# Patient Record
Sex: Female | Born: 1960 | Race: White | Hispanic: No | Marital: Married | Smoking: Never smoker
Health system: Southern US, Community
[De-identification: ages and names within clinical notes are randomized; demographics above are authoritative.]

## PROBLEM LIST (undated history)

## (undated) DIAGNOSIS — D649 Anemia, unspecified: Secondary | ICD-10-CM

## (undated) HISTORY — DX: Anemia, unspecified: D64.9

---

## 1988-10-26 HISTORY — PX: DILATION AND CURETTAGE OF UTERUS: SHX78

## 2013-08-21 ENCOUNTER — Ambulatory Visit (INDEPENDENT_AMBULATORY_CARE_PROVIDER_SITE_OTHER): Payer: BC Managed Care – PPO | Admitting: Obstetrics and Gynecology

## 2013-08-21 ENCOUNTER — Encounter: Payer: Self-pay | Admitting: Obstetrics and Gynecology

## 2013-08-21 VITALS — BP 100/64 | HR 80 | Resp 16 | Ht 63.25 in | Wt 133.0 lb

## 2013-08-21 DIAGNOSIS — Z0000 Encounter for general adult medical examination without abnormal findings: Secondary | ICD-10-CM

## 2013-08-21 DIAGNOSIS — Z01419 Encounter for gynecological examination (general) (routine) without abnormal findings: Secondary | ICD-10-CM

## 2013-08-21 DIAGNOSIS — R319 Hematuria, unspecified: Secondary | ICD-10-CM

## 2013-08-21 LAB — POCT URINALYSIS DIPSTICK
Glucose, UA: NEGATIVE
Nitrite, UA: NEGATIVE
Protein, UA: NEGATIVE
Urobilinogen, UA: NEGATIVE

## 2013-08-21 LAB — HEMOGLOBIN, FINGERSTICK: Hemoglobin, fingerstick: 12.3 g/dL (ref 12.0–16.0)

## 2013-08-21 NOTE — Patient Instructions (Signed)

## 2013-08-21 NOTE — Progress Notes (Signed)
Patient ID: Grace Jackson, female   DOB: Aug 22, 1961, 52 y.o.   MRN: [id number] GYNECOLOGY VISIT  PCP:   None  Referring provider:   HPI: 52 y.o.   Married  Caucasian  female   [phone] with Patient's last menstrual period was 03/26/2012.  October 2013 was last episode of spotting. here for  AEX.   On OCPs for 30 years and stopped it last year upon recommendation of her physician in [place], where she was living at the time.  Patient states she is having significant joint and muscle pain since stopping the birth control pills.  Can't sleep due to the pain. Yoga and omega 3 fatty acids help.  Some hot flashes and night sweats.  Increased headaches. Has flashing light lights.  No nausea. No vaginal dryness.  Hgb:   12.3 Urine:  1+WBC's, 1+RBC's - asymptomatic - no history of UTIs.  GYNECOLOGIC HISTORY: Patient's last menstrual period was 03/26/2012. Sexually active:  yes Partner preference: female Contraception:   postmenopausal Menopausal hormone therapy: n/a DES exposure:  no  Blood transfusions:   no Sexually transmitted diseases:   no GYN Procedures:  no Mammogram:   2012 ONG:[id number]              Pap:  01/2012 [id number]:[id number]. History of abnormal pap smear:  no   OB History   Grav Para Term Preterm Abortions TAB SAB Ect Mult Living   3 3 3       3        LIFESTYLE: Exercise:      Yoga, walking and golf     Tobacco:       no Alcohol:         1 glass of wine per day Drug use:      no  OTHER HEALTH MAINTENANCE: Tetanus/TDap:   2006 Gardisil:  NA Influenza:    06/2012 Zostavax:    never  Bone density:  never Colonoscopy:  never  Cholesterol check: 04-18-13 KVQ:[id number]  Family History  Problem Relation Age of Onset  . Aneurysm Father   . Breast cancer Maternal Grandmother     dx'd late 80's  . Migraines Sister     There are no active problems to display for this patient.  Past Medical History  Diagnosis Date  . Anemia     --in college    Past  Surgical History  Procedure Laterality Date  . Dilation and curettage of uterus  1990    ALLERGIES: Shellfish allergy  Current Outpatient Prescriptions  Medication Sig Dispense Refill  . Multiple Vitamin (MULTIVITAMIN) capsule Take 1 capsule by mouth daily.      Marland Kitchen tretinoin (RETIN-A) 0.05 % cream Apply 1 application topically at bedtime.       No current facility-administered medications for this visit.     ROS:  Pertinent items are noted in HPI.  SOCIAL HISTORY:  Just moved back to [place] from [place], [place].  Husband works for [employer].   Children are 57, 13 and 81 years old.   PHYSICAL EXAMINATION:    BP 100/64  Pulse 80  Resp 16  Ht 5' 3.25" (1.607 m)  Wt 133 lb (60.328 kg)  BMI 23.36 kg/m2  LMP 03/26/2012   Wt Readings from Last 3 Encounters:  08/21/13 133 lb (60.328 kg)     Ht Readings from Last 3 Encounters:  08/21/13 5' 3.25" (1.607 m)    General appearance: alert, cooperative and appears stated age Head: Normocephalic, without obvious abnormality, atraumatic Neck: no adenopathy,  supple, symmetrical, trachea midline and thyroid not enlarged, symmetric, no tenderness/mass/nodules Lungs: clear to auscultation bilaterally Breasts: Inspection negative, No nipple retraction or dimpling, No nipple discharge or bleeding, No axillary or supraclavicular adenopathy, Normal to palpation without dominant masses Heart: regular rate and rhythm Abdomen: soft, non-tender; no masses,  no organomegaly Extremities: extremities normal, atraumatic, no cyanosis or edema Skin: Skin color, texture, turgor normal. No rashes or lesions Lymph nodes: Cervical, supraclavicular, and axillary nodes normal. No abnormal inguinal nodes palpated Neurologic: Grossly normal  Pelvic: External genitalia:  no lesions              Urethra:  normal appearing urethra with no masses, tenderness or lesions              Bartholins and Skenes: normal                 Vagina: normal appearing  vagina with normal color and discharge, no lesions              Cervix: normal appearance              Pap and high risk HPV testing done: yes.            Bimanual Exam:  Uterus:  uterus is normal size, shape, consistency and nontender                                      Adnexa: normal adnexa in size, nontender and no masses                                      Rectovaginal: Confirms                                      Anus:  normal sphincter tone, no lesions  ASSESSMENT  Normal gynecologic exam. Menopausal symptoms of vasomotor instability. Has significant muscular and joint pains which are causing sleep disturbance.  Headaches.  Positive urinalysis.   PLAN  Mammogram at [facility].  Order placed.  Patient will call.  Pap smear and high risk HPV testing. Counseled on HRT risks and benefits.  Will readdress this after patient has had her mammogram.   I recommend that the patient establish care with a PCP regarding her medical concerns.  Urine culture sent.  Return annually or prn   An After Visit Summary was printed and given to the patient.

## 2013-08-22 LAB — URINE CULTURE: Colony Count: NO GROWTH

## 2013-08-23 ENCOUNTER — Ambulatory Visit (HOSPITAL_COMMUNITY)
Admission: RE | Admit: 2013-08-23 | Discharge: 2013-08-23 | Disposition: A | Discharge status: Home / Self Care | Payer: BC Managed Care – PPO | Source: Ambulatory Visit | Attending: Obstetrics and Gynecology | Admitting: Obstetrics and Gynecology

## 2013-08-23 DIAGNOSIS — Z1231 Encounter for screening mammogram for malignant neoplasm of breast: Secondary | ICD-10-CM | POA: Insufficient documentation

## 2013-08-23 DIAGNOSIS — Z01419 Encounter for gynecological examination (general) (routine) without abnormal findings: Secondary | ICD-10-CM

## 2013-08-31 ENCOUNTER — Other Ambulatory Visit: Payer: Self-pay

## 2014-07-04 ENCOUNTER — Other Ambulatory Visit: Payer: Self-pay | Admitting: Nurse Practitioner

## 2014-07-04 ENCOUNTER — Ambulatory Visit
Admission: RE | Admit: 2014-07-04 | Discharge: 2014-07-04 | Disposition: A | Discharge status: Home / Self Care | Payer: BC Managed Care – PPO | Source: Ambulatory Visit | Attending: Nurse Practitioner | Admitting: Nurse Practitioner

## 2014-07-04 DIAGNOSIS — R0602 Shortness of breath: Secondary | ICD-10-CM

## 2014-07-26 ENCOUNTER — Other Ambulatory Visit (HOSPITAL_COMMUNITY): Payer: Self-pay | Admitting: Internal Medicine

## 2014-07-26 DIAGNOSIS — Z1231 Encounter for screening mammogram for malignant neoplasm of breast: Secondary | ICD-10-CM

## 2014-08-10 ENCOUNTER — Other Ambulatory Visit: Payer: Self-pay

## 2014-08-27 ENCOUNTER — Encounter: Payer: Self-pay | Admitting: Obstetrics and Gynecology

## 2014-08-29 ENCOUNTER — Ambulatory Visit (HOSPITAL_COMMUNITY)
Admission: RE | Admit: 2014-08-29 | Discharge: 2014-08-29 | Disposition: A | Discharge status: Home / Self Care | Payer: BC Managed Care – PPO | Source: Ambulatory Visit | Attending: Internal Medicine | Admitting: Internal Medicine

## 2014-08-29 DIAGNOSIS — Z1231 Encounter for screening mammogram for malignant neoplasm of breast: Secondary | ICD-10-CM | POA: Diagnosis not present

## 2015-12-11 ENCOUNTER — Other Ambulatory Visit: Payer: Self-pay

## 2015-12-11 DIAGNOSIS — Z1231 Encounter for screening mammogram for malignant neoplasm of breast: Secondary | ICD-10-CM

## 2016-01-01 ENCOUNTER — Ambulatory Visit: Payer: Self-pay

## 2016-01-08 ENCOUNTER — Ambulatory Visit
Admission: RE | Admit: 2016-01-08 | Discharge: 2016-01-08 | Disposition: A | Discharge status: Home / Self Care | Payer: BLUE CROSS/BLUE SHIELD | Source: Ambulatory Visit

## 2016-01-08 DIAGNOSIS — Z1231 Encounter for screening mammogram for malignant neoplasm of breast: Secondary | ICD-10-CM

## 2016-12-28 ENCOUNTER — Other Ambulatory Visit: Payer: Self-pay | Admitting: Internal Medicine

## 2016-12-28 DIAGNOSIS — Z1231 Encounter for screening mammogram for malignant neoplasm of breast: Secondary | ICD-10-CM

## 2017-01-13 ENCOUNTER — Ambulatory Visit
Admission: RE | Admit: 2017-01-13 | Discharge: 2017-01-13 | Disposition: A | Discharge status: Home / Self Care | Payer: BLUE CROSS/BLUE SHIELD | Source: Ambulatory Visit | Attending: Internal Medicine | Admitting: Internal Medicine

## 2017-01-13 DIAGNOSIS — Z1231 Encounter for screening mammogram for malignant neoplasm of breast: Secondary | ICD-10-CM | POA: Diagnosis not present

## 2017-01-20 ENCOUNTER — Encounter: Payer: Self-pay | Admitting: Obstetrics and Gynecology

## 2017-01-20 ENCOUNTER — Ambulatory Visit (INDEPENDENT_AMBULATORY_CARE_PROVIDER_SITE_OTHER): Payer: BLUE CROSS/BLUE SHIELD | Admitting: Obstetrics and Gynecology

## 2017-01-20 VITALS — BP 100/60 | HR 70 | Resp 14 | Ht 63.0 in | Wt 138.4 lb

## 2017-01-20 DIAGNOSIS — Z01419 Encounter for gynecological examination (general) (routine) without abnormal findings: Secondary | ICD-10-CM

## 2017-01-20 NOTE — Patient Instructions (Signed)

## 2017-01-20 NOTE — Progress Notes (Signed)
56 y.o. [id number] Married Caucasian female here for annual exam.    No vaginal bleeding.  Some hot flashes have returned but not too significant.   Joint pain occurred for about a year when she was last here.  Saw her PCP and was told she may have arthritis.  It did resolve.  Will see her PCP for the first time next month. She have been doing blood work at [employer], her husband's work place.   Mother passed from dementia.  PCP: Raechel Chuteeborah Schoenhoff, MD   Patient's last menstrual period was 03/26/2012 (exact date).           Sexually active: Yes.   female The current method of family planning is post menopausal status.    Exercising: Yes.    Yoga, pilates--daily Smoker:  no  Health Maintenance: Pap:  08-21-13 Neg:Neg HR HPV History of abnormal Pap:  no MMG:  01-13-17 Density B/Neg/Birads1:TBC Colonoscopy:  03-07-14 normal with Dr. Thelma CompMann;next due 02/2024. BMD:   n/a  Result  n/a TDaP:  Within last 10 years Gardasil:   N/A HIV:  Has donated blood within 5 yrs. Hep C:  Has donated blood within 5 yrs. Screening Labs:  Hb today: Labs thru work, Urine today: not done   reports that she has never smoked. She has never used smokeless tobacco. She reports that she drinks about 3.0 oz of alcohol per week . She reports that she does not use drugs.  Past Medical History:  Diagnosis Date  . Anemia    --in college    Past Surgical History:  Procedure Laterality Date  . DILATION AND CURETTAGE OF UTERUS  1990    Current Outpatient Prescriptions  Medication Sig Dispense Refill  . albuterol (PROAIR HFA) 108 (90 Base) MCG/ACT inhaler Inhale 1 puff into the lungs every 6 (six) hours as needed for wheezing or shortness of breath.    Marland Kitchen. aspirin 81 MG chewable tablet Chew 81 mg by mouth daily.    Marland Kitchen. EPINEPHrine (EPIPEN JR) 0.15 MG/0.3ML injection Inject 0.15 mg into the muscle.    . Multiple Vitamin (MULTIVITAMIN) capsule Take 1 capsule by mouth daily.    Marland Kitchen. tretinoin (RETIN-A) 0.05 % cream Apply 1  application topically at bedtime.     No current facility-administered medications for this visit.     Family History  Problem Relation Age of Onset  . Aneurysm Father   . Dementia Mother   . Migraines Sister   . Breast cancer Maternal Grandmother     dx'd late 80's    ROS:  Pertinent items are noted in HPI.  Otherwise, a comprehensive ROS was negative.  Exam:   BP 100/60 (BP Location: Right Arm, Patient Position: Sitting, Cuff Size: Normal)   Pulse 70   Resp 14   Ht 5\' 3"  (1.6 m)   Wt 138 lb 6.4 oz (62.8 kg)   LMP 03/26/2012 (Exact Date)   BMI 24.52 kg/m     General appearance: alert, cooperative and appears stated age Head: Normocephalic, without obvious abnormality, atraumatic Neck: no adenopathy, supple, symmetrical, trachea midline and thyroid normal to inspection and palpation Lungs: clear to auscultation bilaterally Breasts: normal appearance, no masses or tenderness, No nipple retraction or dimpling, No nipple discharge or bleeding, No axillary or supraclavicular adenopathy Heart: regular rate and rhythm Abdomen: soft, non-tender; no masses, no organomegaly Extremities: extremities normal, atraumatic, no cyanosis or edema Skin: Skin color, texture, turgor normal. No rashes or lesions Lymph nodes: Cervical, supraclavicular, and axillary nodes normal.  No abnormal inguinal nodes palpated Neurologic: Grossly normal  Pelvic: External genitalia:  no lesions              Urethra:  normal appearing urethra with no masses, tenderness or lesions              Bartholins and Skenes: normal                 Vagina: normal appearing vagina with normal color and discharge, no lesions              Cervix: no lesions.  Seems fairly short in length.               Pap taken: Yes.   Bimanual Exam:  Uterus:  normal size, contour, position, consistency, mobility, non-tender              Adnexa: no mass, fullness, tenderness              Rectal exam: Yes.  .  Confirms.               Anus:  normal sphincter tone, no lesions  Chaperone was present for exam.  Assessment:   Well woman visit with normal exam.  Plan: Mammogram screening discussed. Recommended self breast awareness. Pap and HR HPV as above. Guidelines for Calcium, Vitamin D, regular exercise program including cardiovascular and weight bearing exercise. Labs through PCP.  Follow up annually and prn.      After visit summary provided.

## 2017-01-22 LAB — IPS PAP TEST WITH HPV

## 2017-03-18 DIAGNOSIS — Z91013 Allergy to seafood: Secondary | ICD-10-CM | POA: Diagnosis not present

## 2017-03-18 DIAGNOSIS — L709 Acne, unspecified: Secondary | ICD-10-CM | POA: Diagnosis not present

## 2017-07-22 DIAGNOSIS — Z0000 Encounter for general adult medical examination without abnormal findings: Secondary | ICD-10-CM | POA: Diagnosis not present

## 2017-07-22 DIAGNOSIS — Z23 Encounter for immunization: Secondary | ICD-10-CM | POA: Diagnosis not present

## 2017-07-29 DIAGNOSIS — Z1211 Encounter for screening for malignant neoplasm of colon: Secondary | ICD-10-CM | POA: Diagnosis not present

## 2017-12-09 DIAGNOSIS — L821 Other seborrheic keratosis: Secondary | ICD-10-CM | POA: Diagnosis not present

## 2017-12-09 DIAGNOSIS — L814 Other melanin hyperpigmentation: Secondary | ICD-10-CM | POA: Diagnosis not present

## 2017-12-09 DIAGNOSIS — B351 Tinea unguium: Secondary | ICD-10-CM | POA: Diagnosis not present

## 2017-12-09 DIAGNOSIS — D225 Melanocytic nevi of trunk: Secondary | ICD-10-CM | POA: Diagnosis not present

## 2017-12-09 DIAGNOSIS — D1801 Hemangioma of skin and subcutaneous tissue: Secondary | ICD-10-CM | POA: Diagnosis not present

## 2017-12-23 ENCOUNTER — Other Ambulatory Visit: Payer: Self-pay | Admitting: Internal Medicine

## 2017-12-23 DIAGNOSIS — Z1231 Encounter for screening mammogram for malignant neoplasm of breast: Secondary | ICD-10-CM

## 2018-01-07 DIAGNOSIS — S0592XA Unspecified injury of left eye and orbit, initial encounter: Secondary | ICD-10-CM | POA: Diagnosis not present

## 2018-01-07 DIAGNOSIS — H20042 Secondary noninfectious iridocyclitis, left eye: Secondary | ICD-10-CM | POA: Diagnosis not present

## 2018-01-07 DIAGNOSIS — H2513 Age-related nuclear cataract, bilateral: Secondary | ICD-10-CM | POA: Diagnosis not present

## 2018-01-07 DIAGNOSIS — H47322 Drusen of optic disc, left eye: Secondary | ICD-10-CM | POA: Diagnosis not present

## 2018-01-10 DIAGNOSIS — H20042 Secondary noninfectious iridocyclitis, left eye: Secondary | ICD-10-CM | POA: Diagnosis not present

## 2018-01-14 ENCOUNTER — Ambulatory Visit: Payer: BLUE CROSS/BLUE SHIELD

## 2018-01-18 DIAGNOSIS — H2513 Age-related nuclear cataract, bilateral: Secondary | ICD-10-CM | POA: Diagnosis not present

## 2018-01-18 DIAGNOSIS — H20042 Secondary noninfectious iridocyclitis, left eye: Secondary | ICD-10-CM | POA: Diagnosis not present

## 2018-01-18 DIAGNOSIS — H47322 Drusen of optic disc, left eye: Secondary | ICD-10-CM | POA: Diagnosis not present

## 2018-01-26 DIAGNOSIS — S52502A Unspecified fracture of the lower end of left radius, initial encounter for closed fracture: Secondary | ICD-10-CM | POA: Diagnosis not present

## 2018-01-26 DIAGNOSIS — M25532 Pain in left wrist: Secondary | ICD-10-CM | POA: Diagnosis not present

## 2018-01-27 DIAGNOSIS — X58XXXA Exposure to other specified factors, initial encounter: Secondary | ICD-10-CM | POA: Diagnosis not present

## 2018-01-27 DIAGNOSIS — S52572A Other intraarticular fracture of lower end of left radius, initial encounter for closed fracture: Secondary | ICD-10-CM | POA: Diagnosis not present

## 2018-01-27 DIAGNOSIS — Y999 Unspecified external cause status: Secondary | ICD-10-CM | POA: Diagnosis not present

## 2018-02-08 ENCOUNTER — Ambulatory Visit
Admission: RE | Admit: 2018-02-08 | Discharge: 2018-02-08 | Disposition: A | Discharge status: Home / Self Care | Payer: BLUE CROSS/BLUE SHIELD | Source: Ambulatory Visit | Attending: Internal Medicine | Admitting: Internal Medicine

## 2018-02-08 DIAGNOSIS — Z1231 Encounter for screening mammogram for malignant neoplasm of breast: Secondary | ICD-10-CM

## 2018-02-09 ENCOUNTER — Encounter: Payer: Self-pay | Admitting: Obstetrics and Gynecology

## 2018-02-09 ENCOUNTER — Ambulatory Visit: Payer: BLUE CROSS/BLUE SHIELD | Admitting: Obstetrics and Gynecology

## 2018-02-09 ENCOUNTER — Other Ambulatory Visit: Payer: Self-pay

## 2018-02-09 VITALS — BP 110/60 | HR 68 | Resp 16 | Ht 63.5 in | Wt 142.0 lb

## 2018-02-09 DIAGNOSIS — Z01419 Encounter for gynecological examination (general) (routine) without abnormal findings: Secondary | ICD-10-CM | POA: Diagnosis not present

## 2018-02-09 NOTE — Patient Instructions (Signed)

## 2018-02-09 NOTE — Progress Notes (Signed)
57 y.o. [id number] Married Caucasian female here for annual exam.    No vaginal bleeding or spotting.   Gaining weight fast and felt constipated a month ago. Took laxatives, and this resolved.  This is a big change for her.  Still feels heavy. Fingernails dry and peeling.   Had a wrist fracture 2 weeks ago and had surgery.  Fell playing tennis.   Saw PCP in October 2018 as new provider.  Had labs then.   PCP: Dr. Constance GoltzSchoenhoff   Patient's last menstrual period was 03/26/2012 (exact date).           Sexually active: Yes.    The current method of family planning is post menopausal status.    Exercising: Yes.    tennis, golf, yoga Smoker:  no  Health Maintenance: Pap:  01/20/17 Pap and HR HPV negative History of abnormal Pap:  no MMG:  02/08/18 TBC: not resulted yet Colonoscopy:  03-07-14 normal with Dr. Thelma CompMann;next due 02/2024. BMD:   n/a  Result  n/a TDaP:  Per patient up to date Gardasil:   n/a HIV: patient has donated blood in the past Hep C: patient has donated blood in the past Screening Labs:  PCP   reports that she has never smoked. She has never used smokeless tobacco. She reports that she drinks about 3.0 oz of alcohol per week. She reports that she does not use drugs.  Past Medical History:  Diagnosis Date  . Anemia    --in college    Past Surgical History:  Procedure Laterality Date  . DILATION AND CURETTAGE OF UTERUS  1990    Current Outpatient Medications  Medication Sig Dispense Refill  . albuterol (PROAIR HFA) 108 (90 Base) MCG/ACT inhaler Inhale 1 puff into the lungs every 6 (six) hours as needed for wheezing or shortness of breath.    . Ascorbic Acid (VITAMIN C) 1000 MG tablet     . Calcium Carb-Cholecalciferol (CALTRATE 600+D3) 600-800 MG-UNIT TABS     . EPINEPHrine (EPIPEN JR) 0.15 MG/0.3ML injection Inject 0.15 mg into the muscle.    . Omega-3 Fatty Acids (OMEGA-3 FISH OIL PO) daily. 1280mg     . tretinoin (RETIN-A) 0.05 % cream Apply 1 application  topically at bedtime.     No current facility-administered medications for this visit.     Family History  Problem Relation Age of Onset  . Aneurysm Father   . Dementia Mother   . Migraines Sister   . Breast cancer Maternal Grandmother        dx'd late 80's    ROS:  Pertinent items are noted in HPI.  Otherwise, a comprehensive ROS was negative.  Exam:   BP 110/60 (BP Location: Right Arm, Patient Position: Sitting, Cuff Size: Normal)   Pulse 68   Resp 16   Ht 5' 3.5" (1.613 m)   Wt 142 lb (64.4 kg)   LMP 03/26/2012 (Exact Date)   BMI 24.76 kg/m     General appearance: alert, cooperative and appears stated age Head: Normocephalic, without obvious abnormality, atraumatic Neck: no adenopathy, supple, symmetrical, trachea midline and thyroid normal to inspection and palpation Lungs: clear to auscultation bilaterally Breasts: normal appearance, no masses or tenderness, No nipple retraction or dimpling, No nipple discharge or bleeding, No axillary or supraclavicular adenopathy Heart: regular rate and rhythm Abdomen: soft, non-tender; no masses, no organomegaly Extremities: extremities normal, atraumatic, no cyanosis or edema Skin: Skin color, texture, turgor normal. No rashes or lesions Lymph nodes: Cervical, supraclavicular, and axillary  nodes normal. No abnormal inguinal nodes palpated Neurologic: Grossly normal  Pelvic: External genitalia:  no lesions              Urethra:  normal appearing urethra with no masses, tenderness or lesions              Bartholins and Skenes: normal                 Vagina: normal appearing vagina with normal color and discharge, no lesions              Cervix: no lesions              Pap taken: No. Bimanual Exam:  Uterus:  normal size, contour, position, consistency, mobility, non-tender              Adnexa: no mass, fullness, tenderness              Rectal exam: No..  Confirms.              Anus:  normal sphincter tone, no lesions  Chaperone  was present for exam.  Assessment:   Well woman visit with normal exam. Constipation.  FH breast cancer.   Plan: Mammogram screening yearly.  Recommended self breast awareness. Pap and HR HPV not indicated. Guidelines for Calcium, Vitamin D, regular exercise program including cardiovascular and weight bearing exercise. Colace 100 mg po q hs prn.  Discussed probiotics.  Will check TSH.    Follow up annually and prn.   After visit summary provided.

## 2018-02-10 LAB — TSH: TSH: 4.09 u[IU]/mL (ref 0.450–4.500)

## 2018-02-14 DIAGNOSIS — S5292XD Unspecified fracture of left forearm, subsequent encounter for closed fracture with routine healing: Secondary | ICD-10-CM | POA: Diagnosis not present

## 2018-02-24 DIAGNOSIS — M25532 Pain in left wrist: Secondary | ICD-10-CM | POA: Diagnosis not present

## 2018-03-03 DIAGNOSIS — M25532 Pain in left wrist: Secondary | ICD-10-CM | POA: Diagnosis not present

## 2018-03-10 DIAGNOSIS — M25532 Pain in left wrist: Secondary | ICD-10-CM | POA: Diagnosis not present

## 2018-03-24 DIAGNOSIS — M25532 Pain in left wrist: Secondary | ICD-10-CM | POA: Diagnosis not present

## 2018-03-28 DIAGNOSIS — M25532 Pain in left wrist: Secondary | ICD-10-CM | POA: Diagnosis not present

## 2018-03-30 DIAGNOSIS — M25532 Pain in left wrist: Secondary | ICD-10-CM | POA: Diagnosis not present

## 2018-04-06 DIAGNOSIS — M25532 Pain in left wrist: Secondary | ICD-10-CM | POA: Diagnosis not present

## 2018-04-13 DIAGNOSIS — M25532 Pain in left wrist: Secondary | ICD-10-CM | POA: Diagnosis not present

## 2018-04-20 DIAGNOSIS — M25532 Pain in left wrist: Secondary | ICD-10-CM | POA: Diagnosis not present

## 2018-05-02 DIAGNOSIS — M25532 Pain in left wrist: Secondary | ICD-10-CM | POA: Diagnosis not present

## 2018-05-10 DIAGNOSIS — M25532 Pain in left wrist: Secondary | ICD-10-CM | POA: Diagnosis not present

## 2018-05-16 DIAGNOSIS — S5292XD Unspecified fracture of left forearm, subsequent encounter for closed fracture with routine healing: Secondary | ICD-10-CM | POA: Diagnosis not present

## 2018-05-16 DIAGNOSIS — M25532 Pain in left wrist: Secondary | ICD-10-CM | POA: Diagnosis not present

## 2019-01-04 ENCOUNTER — Other Ambulatory Visit: Payer: Self-pay | Admitting: Obstetrics and Gynecology

## 2019-01-04 DIAGNOSIS — Z1231 Encounter for screening mammogram for malignant neoplasm of breast: Secondary | ICD-10-CM

## 2019-03-09 ENCOUNTER — Ambulatory Visit: Payer: BLUE CROSS/BLUE SHIELD | Admitting: Obstetrics and Gynecology

## 2019-03-24 ENCOUNTER — Ambulatory Visit: Payer: Self-pay

## 2019-04-08 IMAGING — MG DIGITAL SCREENING BILATERAL MAMMOGRAM WITH TOMO AND CAD
8 series · 9 of 24 positions shown · non-contrast
Comparison: Previous exam(s).

CLINICAL DATA: Screening.

EXAM:
DIGITAL SCREENING BILATERAL MAMMOGRAM WITH TOMO AND CAD

[L CC synth-2D]
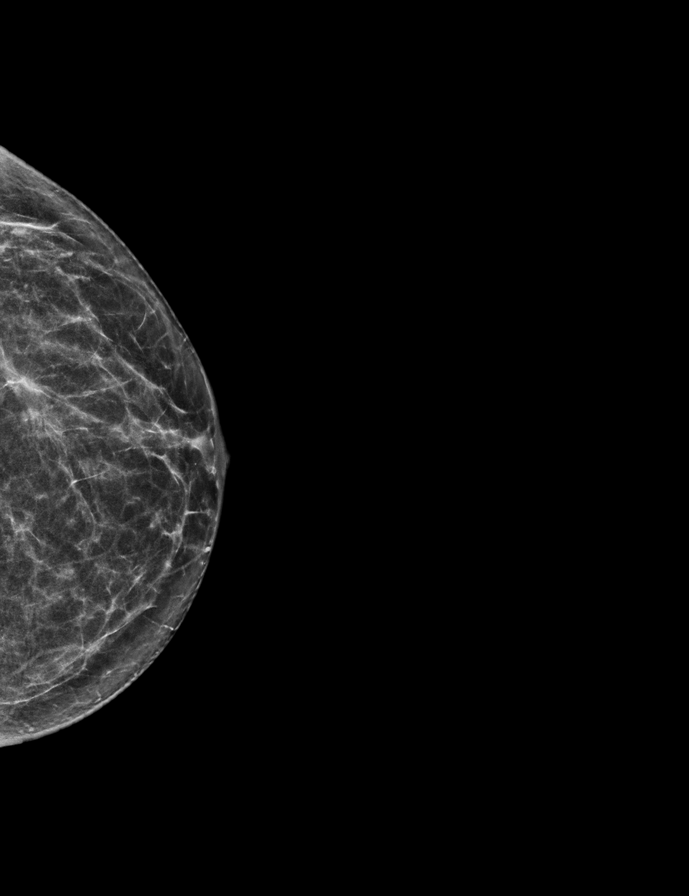

[R MLO synth-2D]
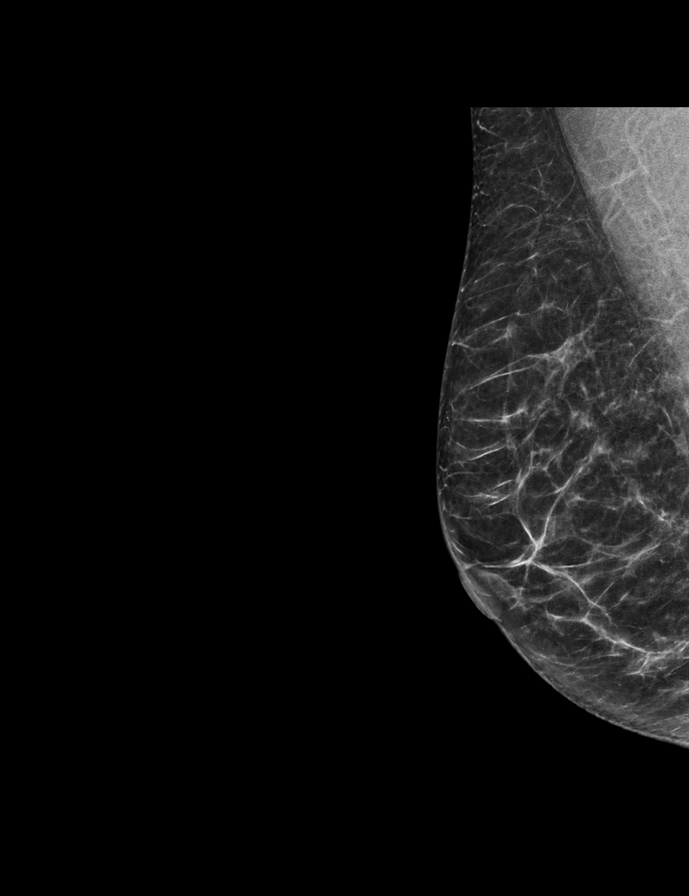

[R CC synth-2D]
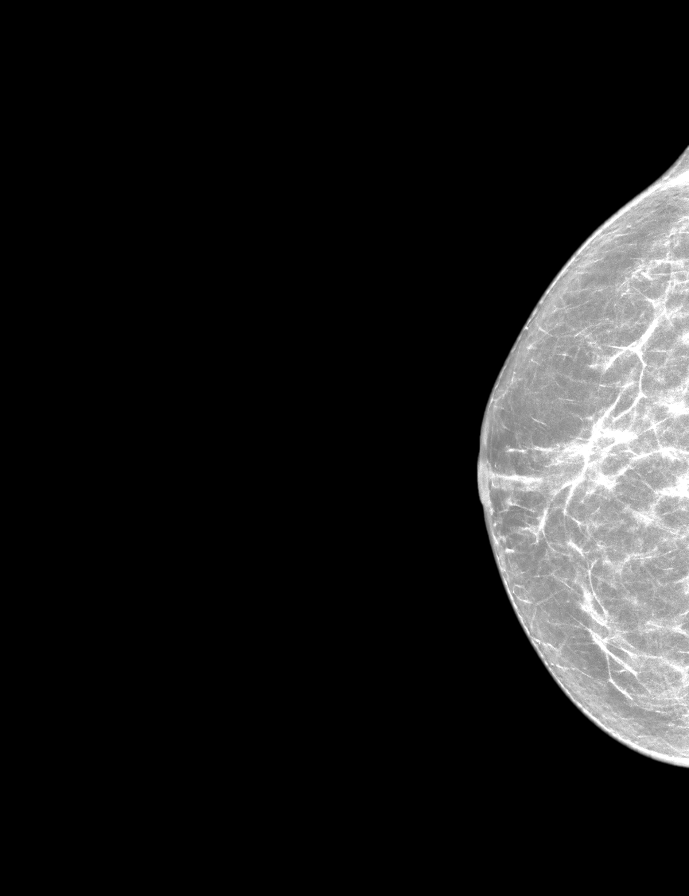

[L MLO synth-2D]
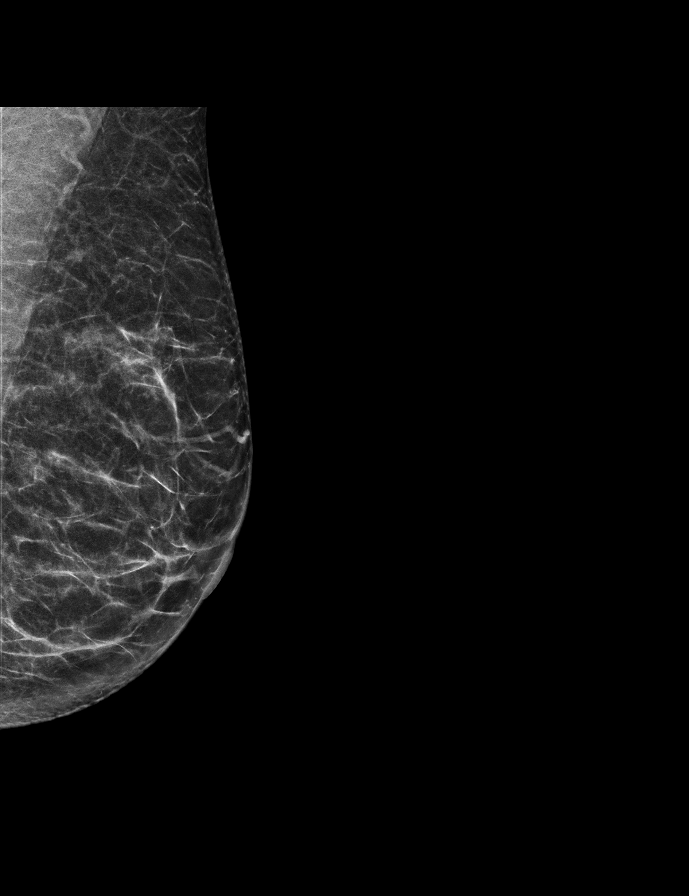

[L CC tomo · 2 of 56 frames shown]
[frame 19/56]
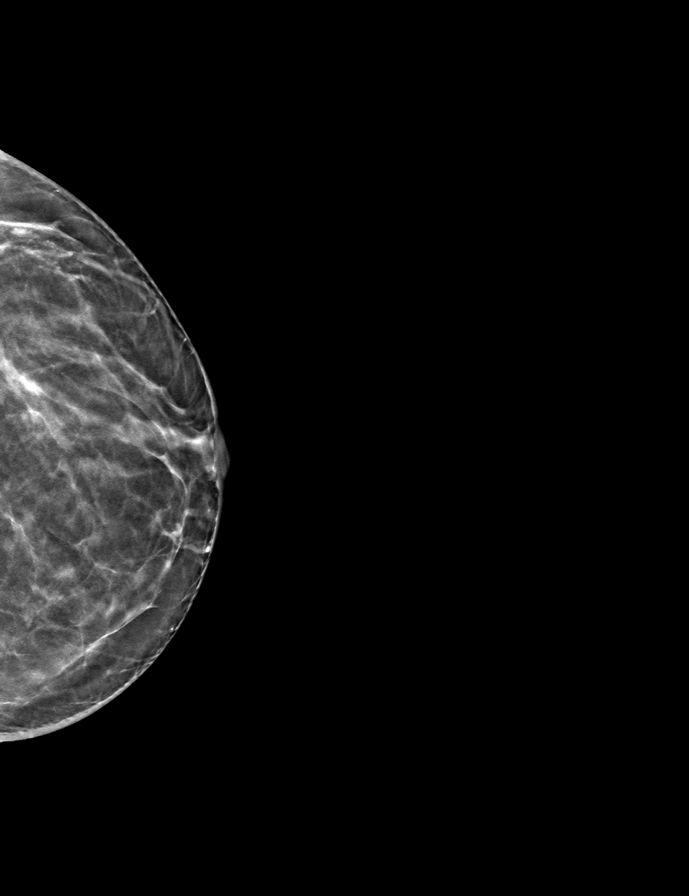
[frame 29/56]
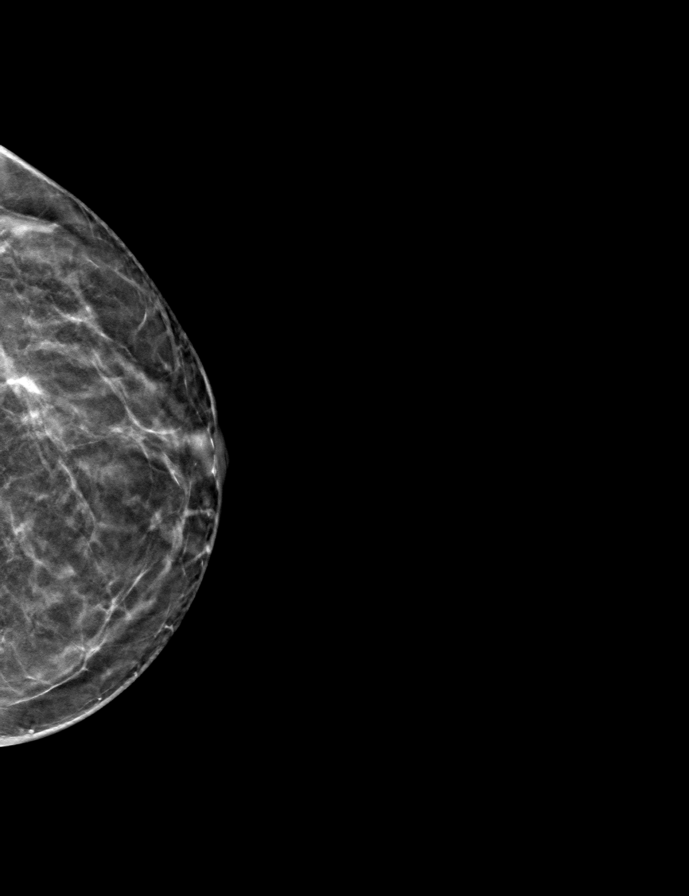

[R CC tomo · tomo slice 29/57.0]
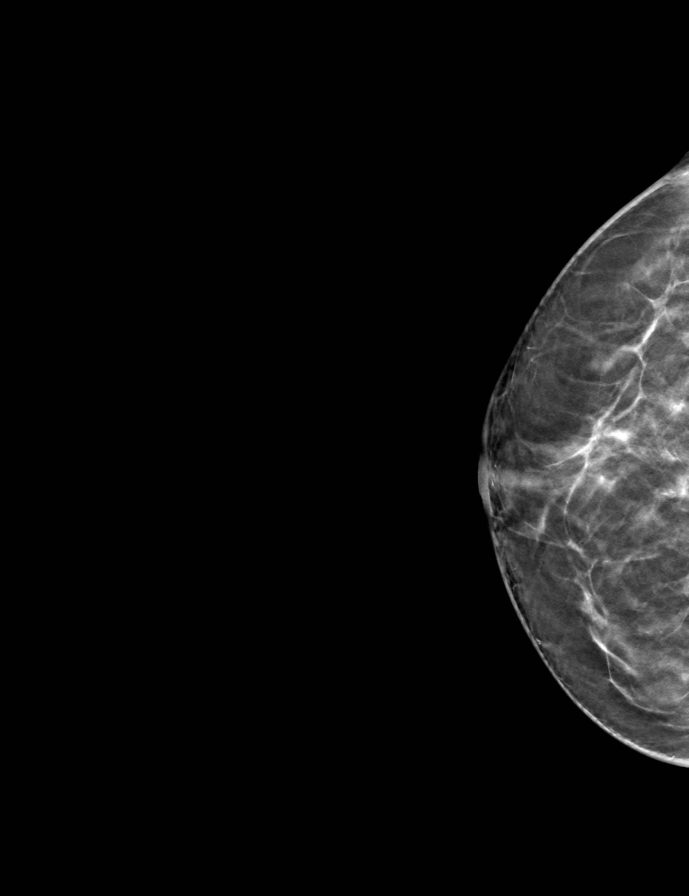

[L MLO tomo · tomo slice 29/57.0]
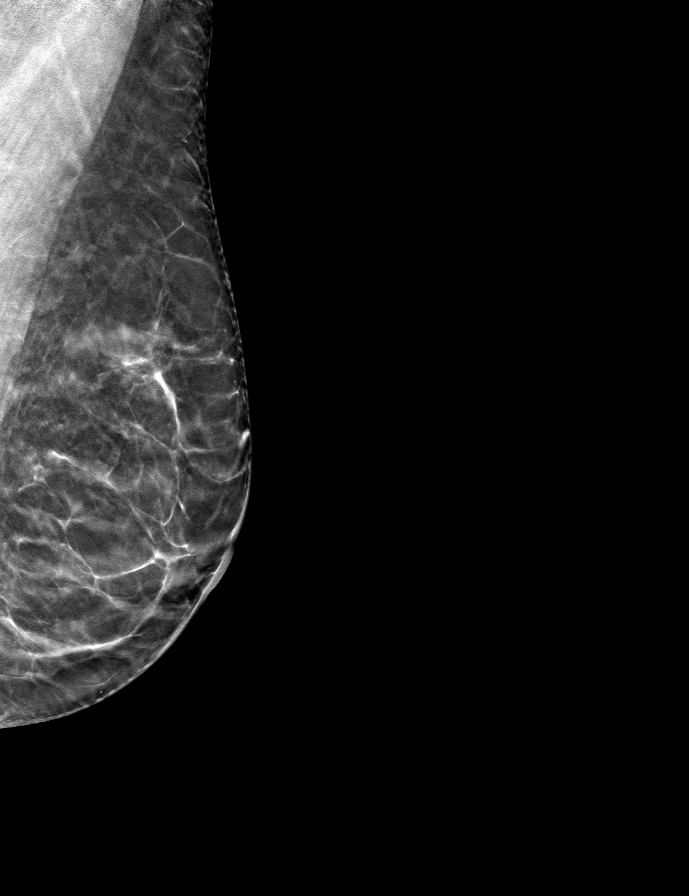

[R MLO tomo · tomo slice 29/58.0]
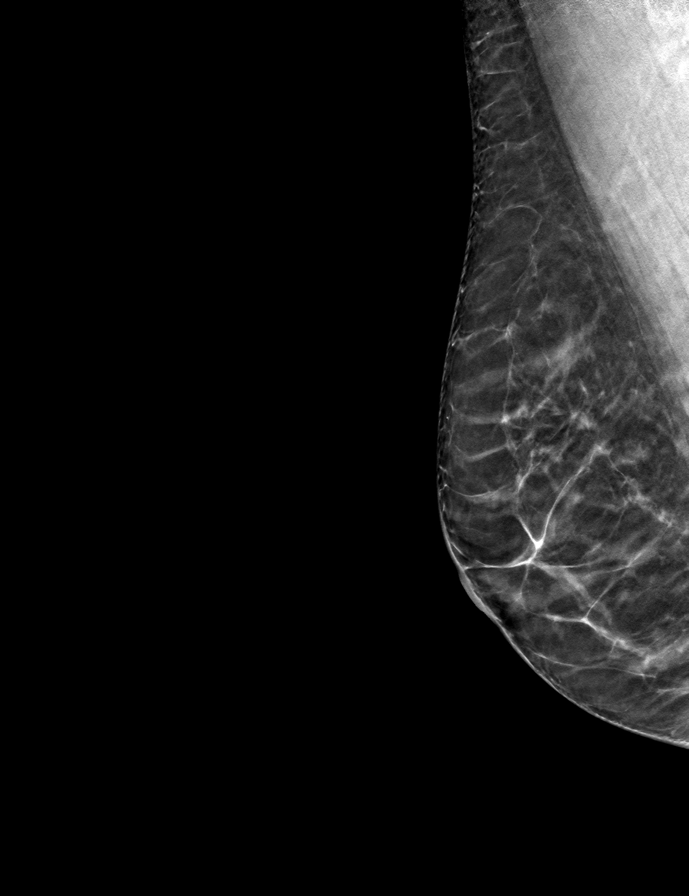

[9 of 24 positions shown; findings below may reference images not displayed]

ACR Breast Density Category b: There are scattered areas of
fibroglandular density.
FINDINGS: There are no findings suspicious for malignancy. Images were
processed with CAD.
IMPRESSION: No mammographic evidence of malignancy. A result letter of this
screening mammogram will be mailed directly to the patient.

RECOMMENDATION:
Screening mammogram in one year. (Code:[id number])

BI-RADS CATEGORY  1: Negative.

## 2020-03-06 ENCOUNTER — Other Ambulatory Visit: Payer: Self-pay | Admitting: Obstetrics and Gynecology

## 2020-03-06 DIAGNOSIS — Z1231 Encounter for screening mammogram for malignant neoplasm of breast: Secondary | ICD-10-CM

## 2020-09-26 ENCOUNTER — Ambulatory Visit
Admission: RE | Admit: 2020-09-26 | Discharge: 2020-09-26 | Disposition: A | Discharge status: Home / Self Care | Payer: Self-pay | Source: Ambulatory Visit | Attending: Obstetrics and Gynecology | Admitting: Obstetrics and Gynecology

## 2020-09-26 ENCOUNTER — Other Ambulatory Visit: Payer: Self-pay

## 2020-09-26 DIAGNOSIS — Z1231 Encounter for screening mammogram for malignant neoplasm of breast: Secondary | ICD-10-CM | POA: Diagnosis not present

## 2021-02-11 NOTE — Progress Notes (Signed)
60 y.o. [id number] Married Caucasian female here for annual exam.    Looking for a new primary care provider.   Did her first shingles vaccine.  She is doing this at her local pharmacy.  Still enjoying tennis.  2 children moved [place] during pandemic. Planning a trip to [place] with family.   Vaccinated against Covid. No booster done.   PCP: None    Patient's last menstrual period was 03/26/2012 (exact date).           Sexually active: Yes.    The current method of family planning is post menopausal status.    Exercising: Yes.    tennis, golf, yoga Smoker:  no  Health Maintenance: Pap: 01-20-17 Neg:Neg HR HPV, 08-21-13 Neg:Neg HR HPV History of abnormal Pap:  no MMG: 09-26-20 3D/Neg/Birads1 Colonoscopy: 03-07-14 normal;next 02/2024 BMD:   n/a  Result  n/a TDaP: within 7 years Gardasil:   no IRJ:JOACZYS blood in past Hep C:donated blood in past Screening Labs:  PCP.   reports that she has never smoked. She has never used smokeless tobacco. She reports current alcohol use of about 5.0 standard drinks of alcohol per week. She reports that she does not use drugs.  Past Medical History:  Diagnosis Date  . Anemia    --in college    Past Surgical History:  Procedure Laterality Date  . DILATION AND CURETTAGE OF UTERUS  1990    Current Outpatient Medications  Medication Sig Dispense Refill  . albuterol (VENTOLIN HFA) 108 (90 Base) MCG/ACT inhaler Inhale 1 puff into the lungs every 6 (six) hours as needed for wheezing or shortness of breath.    . EPINEPHrine (EPIPEN JR) 0.15 MG/0.3ML injection Inject 0.15 mg into the muscle.    . tretinoin (RETIN-A) 0.05 % cream Apply 1 application topically at bedtime.     No current facility-administered medications for this visit.    Family History  Problem Relation Age of Onset  . Aneurysm Father   . Dementia Mother   . Migraines Sister   . Breast cancer Maternal Grandmother        dx'd late 80's    Review of Systems  All other  systems reviewed and are negative.   Exam:   BP 100/74   Pulse 81   Ht 5' 2.75" (1.594 m)   Wt 145 lb (65.8 kg)   LMP 03/26/2012 (Exact Date)   SpO2 100%   BMI 25.89 kg/m     General appearance: alert, cooperative and appears stated age Head: normocephalic, without obvious abnormality, atraumatic Neck: no adenopathy, supple, symmetrical, trachea midline and thyroid normal to inspection and palpation Lungs: clear to auscultation bilaterally Breasts: normal appearance, no masses or tenderness, No nipple retraction or dimpling, No nipple discharge or bleeding, No axillary adenopathy Heart: regular rate and rhythm Abdomen: soft, non-tender; no masses, no organomegaly Extremities: extremities normal, atraumatic, no cyanosis or edema Skin: skin color, texture, turgor normal. No rashes or lesions Lymph nodes: cervical, supraclavicular, and axillary nodes normal. Neurologic: grossly normal  Pelvic: External genitalia:  no lesions              No abnormal inguinal nodes palpated.              Urethra:  normal appearing urethra with no masses, tenderness or lesions              Bartholins and Skenes: normal                 Vagina:  normal appearing vagina with normal color and discharge, no lesions              Cervix: no lesions              Pap taken: No. Bimanual Exam:  Uterus:  normal size, contour, position, consistency, mobility, non-tender              Adnexa: no mass, fullness, tenderness              Rectal exam: Yes.  .  Confirms.              Anus:  normal sphincter tone, no lesions  Chaperone was present for exam.  Assessment:   Well woman visit with normal exam.  Plan: Mammogram screening discussed. Self breast awareness reviewed. Pap and HR HPV in 2023. Guidelines for Calcium, Vitamin D, regular exercise program including cardiovascular and weight bearing exercise. Labs with PCP. Follow up annually and prn.

## 2021-02-12 ENCOUNTER — Encounter: Payer: Self-pay | Admitting: Obstetrics and Gynecology

## 2021-02-12 ENCOUNTER — Other Ambulatory Visit: Payer: Self-pay

## 2021-02-12 ENCOUNTER — Ambulatory Visit (INDEPENDENT_AMBULATORY_CARE_PROVIDER_SITE_OTHER): Payer: 59 | Admitting: Obstetrics and Gynecology

## 2021-02-12 VITALS — BP 100/74 | HR 81 | Ht 62.75 in | Wt 145.0 lb

## 2021-02-12 DIAGNOSIS — Z01419 Encounter for gynecological examination (general) (routine) without abnormal findings: Secondary | ICD-10-CM

## 2021-02-12 NOTE — Patient Instructions (Signed)

## 2021-11-21 ENCOUNTER — Other Ambulatory Visit: Payer: Self-pay | Admitting: Obstetrics and Gynecology

## 2021-11-21 DIAGNOSIS — Z1231 Encounter for screening mammogram for malignant neoplasm of breast: Secondary | ICD-10-CM

## 2021-11-24 ENCOUNTER — Ambulatory Visit
Admission: RE | Admit: 2021-11-24 | Discharge: 2021-11-24 | Disposition: A | Discharge status: Home / Self Care | Payer: 59 | Source: Ambulatory Visit | Attending: Obstetrics and Gynecology | Admitting: Obstetrics and Gynecology

## 2021-11-24 DIAGNOSIS — Z1231 Encounter for screening mammogram for malignant neoplasm of breast: Secondary | ICD-10-CM

## 2022-02-10 NOTE — Progress Notes (Signed)
61 y.o. [id number] Married Caucasian female here for annual exam.   ? ?No GYN concerns.  ? ?2 grandchildren.  ?Daughter marrying.  ?Husband retiring.  ? ?PCP: Aliene Beams, MD ? ?Patient's last menstrual period was 03/26/2012 (exact date).     ?  ?    ?Sexually active: Yes.    ?The current method of family planning is post menopausal status.    ?Exercising: Yes.     Yoga, tennis and golf ?Smoker:  no ? ?Health Maintenance: ?Pap:  01-20-17 Neg:Neg HR HPV, 08-21-13 Neg:Neg HR HPV ?History of abnormal Pap:  no ?MMG:  11-24-21 Neg/Birads1 ?Colonoscopy:  03-07-14 normal;next 02/2024 ?BMD: N/a  Result  N/a ?TDaP: UTD ?Gardasil:   no ?HIV: Donated blood in past ?Hep C: donated blood in past ?Screening Labs:  PCP ?Shingrix:  completed. ? ? reports that she has never smoked. She has never used smokeless tobacco. She reports current alcohol use of about 5.0 standard drinks per week. She reports that she does not use drugs. ? ?Past Medical History:  ?Diagnosis Date  ? Anemia   ? --in college  ? ? ?Past Surgical History:  ?Procedure Laterality Date  ? DILATION AND CURETTAGE OF UTERUS  1990  ? ? ?Current Outpatient Medications  ?Medication Sig Dispense Refill  ? albuterol (VENTOLIN HFA) 108 (90 Base) MCG/ACT inhaler Inhale 1 puff into the lungs every 6 (six) hours as needed for wheezing or shortness of breath.    ? EPINEPHrine (EPIPEN JR) 0.15 MG/0.3ML injection Inject 0.15 mg into the muscle.    ? tretinoin (RETIN-A) 0.05 % cream 1 application to affected area in the evening to face    ? ?No current facility-administered medications for this visit.  ? ? ?Family History  ?Problem Relation Age of Onset  ? Aneurysm Father   ? Dementia Mother   ? Migraines Sister   ? Breast cancer Maternal Grandmother   ?     dx'd late 80's  ? ? ?Review of Systems  ?All other systems reviewed and are negative. ? ?Exam:   ?BP (!) 100/58   Pulse 76   Ht 5\' 3"  (1.6 m)   Wt 146 lb (66.2 kg)   LMP 03/26/2012 (Exact Date)   SpO2 98%   BMI 25.86 kg/m?      ?General appearance: alert, cooperative and appears stated age ?Head: normocephalic, without obvious abnormality, atraumatic ?Neck: no adenopathy, supple, symmetrical, trachea midline and thyroid normal to inspection and palpation ?Lungs: clear to auscultation bilaterally ?Breasts: normal appearance, no masses or tenderness, No nipple retraction or dimpling, No nipple discharge or bleeding, No axillary adenopathy ?Heart: regular rate and rhythm ?Abdomen: soft, non-tender; no masses, no organomegaly ?Extremities: extremities normal, atraumatic, no cyanosis or edema ?Skin: skin color, texture, turgor normal. No rashes or lesions ?Lymph nodes: cervical, supraclavicular, and axillary nodes normal. ?Neurologic: grossly normal ? ?Pelvic: External genitalia:  no lesions ?             No abnormal inguinal nodes palpated. ?             Urethra:  normal appearing urethra with no masses, tenderness or lesions ?             Bartholins and Skenes: normal    ?             Vagina: normal appearing vagina with normal color and discharge, no lesions ?             Cervix: no lesions ?  Pap taken: yes ?Bimanual Exam:  Uterus:  normal size, contour, position, consistency, mobility, non-tender ?             Adnexa: no mass, fullness, tenderness ?             Rectal exam: yes.  Confirms. ?             Anus:  normal sphincter tone, no lesions ? ?Chaperone was present for exam:  Marchelle Folks, CMA ? ?Assessment:   ?Well woman visit with gynecologic exam. ? ?Plan: ?Mammogram screening discussed. ?Self breast awareness reviewed. ?Pap and HR HPV collected. ?Guidelines for Calcium, Vitamin D, regular exercise program including cardiovascular and weight bearing exercise. ?  ?Follow up annually and prn.  ? ?After visit summary provided.  ? ? ? ?

## 2022-02-16 ENCOUNTER — Other Ambulatory Visit (HOSPITAL_COMMUNITY)
Admission: RE | Admit: 2022-02-16 | Discharge: 2022-02-16 | Disposition: A | Discharge status: Home / Self Care | Payer: 59 | Source: Ambulatory Visit | Attending: Obstetrics and Gynecology | Admitting: Obstetrics and Gynecology

## 2022-02-16 ENCOUNTER — Ambulatory Visit (INDEPENDENT_AMBULATORY_CARE_PROVIDER_SITE_OTHER): Payer: 59 | Admitting: Obstetrics and Gynecology

## 2022-02-16 ENCOUNTER — Encounter: Payer: Self-pay | Admitting: Obstetrics and Gynecology

## 2022-02-16 VITALS — BP 100/58 | HR 76 | Ht 63.0 in | Wt 146.0 lb

## 2022-02-16 DIAGNOSIS — Z01419 Encounter for gynecological examination (general) (routine) without abnormal findings: Secondary | ICD-10-CM

## 2022-02-16 DIAGNOSIS — Z124 Encounter for screening for malignant neoplasm of cervix: Secondary | ICD-10-CM

## 2022-02-16 NOTE — Patient Instructions (Signed)

## 2022-02-19 LAB — CYTOLOGY - PAP
Comment: NEGATIVE
Diagnosis: UNDETERMINED — AB
High risk HPV: NEGATIVE

## 2022-11-19 ENCOUNTER — Other Ambulatory Visit: Payer: Self-pay | Admitting: Obstetrics and Gynecology

## 2022-11-19 DIAGNOSIS — Z1231 Encounter for screening mammogram for malignant neoplasm of breast: Secondary | ICD-10-CM

## 2022-12-02 ENCOUNTER — Ambulatory Visit
Admission: RE | Admit: 2022-12-02 | Discharge: 2022-12-02 | Disposition: A | Discharge status: Home / Self Care | Payer: 59 | Source: Ambulatory Visit | Attending: Obstetrics and Gynecology | Admitting: Obstetrics and Gynecology

## 2022-12-02 DIAGNOSIS — Z1231 Encounter for screening mammogram for malignant neoplasm of breast: Secondary | ICD-10-CM

## 2023-02-10 NOTE — Progress Notes (Deleted)
62 y.o. G70P3003 Married Caucasian female here for annual exam.    PCP:     Patient's last menstrual period was 03/26/2012 (exact date).           Sexually active: {yes no:314532}  The current method of family planning is post menopausal status.    Exercising: {yes no:314532}  {types:19826} Smoker:  no  Health Maintenance: Pap:  02/16/22 ASCUS: HR HPV neg, 01/20/17 neg: HR HPV neg History of abnormal Pap:  no MMG:  12/02/22 Breast Density Cat B, BI-RADS CAT 1 neg Colonoscopy:  03/07/14 normal, next 02/2024 BMD:   n/a  Result  n/a TDaP:  UTD Gardasil:   no HIV: donated blood in past Hep C: donated blood in past Screening Labs:  Hb today: ***, Urine today: ***   reports that she has never smoked. She has never used smokeless tobacco. She reports current alcohol use of about 5.0 standard drinks of alcohol per week. She reports that she does not use drugs.  Past Medical History:  Diagnosis Date   Anemia    --in college    Past Surgical History:  Procedure Laterality Date   DILATION AND CURETTAGE OF UTERUS  1990    Current Outpatient Medications  Medication Sig Dispense Refill   albuterol (VENTOLIN HFA) 108 (90 Base) MCG/ACT inhaler Inhale 1 puff into the lungs every 6 (six) hours as needed for wheezing or shortness of breath.     EPINEPHrine (EPIPEN JR) 0.15 MG/0.3ML injection Inject 0.15 mg into the muscle.     tretinoin (RETIN-A) 0.05 % cream 1 application to affected area in the evening to face     No current facility-administered medications for this visit.    Family History  Problem Relation Age of Onset   Aneurysm Father    Dementia Mother    Migraines Sister    Breast cancer Maternal Grandmother        dx'd late 62's    Review of Systems  Exam:   LMP 03/26/2012 (Exact Date)     General appearance: alert, cooperative and appears stated age Head: normocephalic, without obvious abnormality, atraumatic Neck: no adenopathy, supple, symmetrical, trachea midline and  thyroid normal to inspection and palpation Lungs: clear to auscultation bilaterally Breasts: normal appearance, no masses or tenderness, No nipple retraction or dimpling, No nipple discharge or bleeding, No axillary adenopathy Heart: regular rate and rhythm Abdomen: soft, non-tender; no masses, no organomegaly Extremities: extremities normal, atraumatic, no cyanosis or edema Skin: skin color, texture, turgor normal. No rashes or lesions Lymph nodes: cervical, supraclavicular, and axillary nodes normal. Neurologic: grossly normal  Pelvic: External genitalia:  no lesions              No abnormal inguinal nodes palpated.              Urethra:  normal appearing urethra with no masses, tenderness or lesions              Bartholins and Skenes: normal                 Vagina: normal appearing vagina with normal color and discharge, no lesions              Cervix: no lesions              Pap taken: {yes no:314532} Bimanual Exam:  Uterus:  normal size, contour, position, consistency, mobility, non-tender              Adnexa: no mass, fullness,  tenderness              Rectal exam: {yes no:314532}.  Confirms.              Anus:  normal sphincter tone, no lesions  Chaperone was present for exam:  ***  Assessment:   Well woman visit with gynecologic exam.   Plan: Mammogram screening discussed. Self breast awareness reviewed. Pap and HR HPV as above. Guidelines for Calcium, Vitamin D, regular exercise program including cardiovascular and weight bearing exercise.   Follow up annually and prn.   Additional counseling given.  {yes [id number]. _______ minutes face to face time of which over 50% was spent in counseling.    After visit summary provided.

## 2023-02-24 ENCOUNTER — Ambulatory Visit: Payer: 59 | Admitting: Obstetrics and Gynecology

## 2023-09-28 ENCOUNTER — Other Ambulatory Visit (HOSPITAL_COMMUNITY): Payer: Self-pay | Admitting: Family Medicine

## 2023-09-28 DIAGNOSIS — Z8249 Family history of ischemic heart disease and other diseases of the circulatory system: Secondary | ICD-10-CM

## 2023-10-13 ENCOUNTER — Ambulatory Visit (HOSPITAL_COMMUNITY): Payer: 59

## 2023-11-04 ENCOUNTER — Ambulatory Visit (HOSPITAL_COMMUNITY)
Admission: RE | Admit: 2023-11-04 | Discharge: 2023-11-04 | Disposition: A | Discharge status: Home / Self Care | Payer: Self-pay | Source: Ambulatory Visit | Attending: Family Medicine | Admitting: Family Medicine

## 2023-11-04 DIAGNOSIS — Z8249 Family history of ischemic heart disease and other diseases of the circulatory system: Secondary | ICD-10-CM | POA: Insufficient documentation

## 2023-11-19 ENCOUNTER — Other Ambulatory Visit: Payer: Self-pay | Admitting: Obstetrics and Gynecology

## 2023-11-19 DIAGNOSIS — Z1231 Encounter for screening mammogram for malignant neoplasm of breast: Secondary | ICD-10-CM

## 2023-12-07 ENCOUNTER — Ambulatory Visit
Admission: RE | Admit: 2023-12-07 | Discharge: 2023-12-07 | Disposition: A | Discharge status: Home / Self Care | Payer: 59 | Source: Ambulatory Visit | Attending: Obstetrics and Gynecology | Admitting: Obstetrics and Gynecology

## 2023-12-07 DIAGNOSIS — Z1231 Encounter for screening mammogram for malignant neoplasm of breast: Secondary | ICD-10-CM

## 2023-12-09 ENCOUNTER — Encounter: Payer: Self-pay | Admitting: Obstetrics and Gynecology

## 2024-12-08 ENCOUNTER — Institutional Professional Consult (permissible substitution) (INDEPENDENT_AMBULATORY_CARE_PROVIDER_SITE_OTHER)
